# Patient Record
Sex: Female | Born: 1955 | Race: White | Hispanic: No | Marital: Married | Smoking: Former smoker
Health system: Southern US, Community
[De-identification: ages and names within clinical notes are randomized; demographics above are authoritative.]

## PROBLEM LIST (undated history)

## (undated) DIAGNOSIS — J45909 Unspecified asthma, uncomplicated: Secondary | ICD-10-CM

## (undated) DIAGNOSIS — A770 Spotted fever due to Rickettsia rickettsii: Secondary | ICD-10-CM

## (undated) DIAGNOSIS — Z8619 Personal history of other infectious and parasitic diseases: Secondary | ICD-10-CM

## (undated) HISTORY — PX: TONSILLECTOMY: SUR1361

## (undated) HISTORY — PX: REPLACEMENT TOTAL KNEE: SUR1224

## (undated) HISTORY — PX: RHINOPLASTY: SUR1284

---

## 2010-06-14 ENCOUNTER — Ambulatory Visit: Payer: Self-pay | Admitting: Internal Medicine

## 2010-11-09 ENCOUNTER — Ambulatory Visit: Payer: Self-pay | Admitting: Family Medicine

## 2015-01-29 ENCOUNTER — Ambulatory Visit
Admission: RE | Admit: 2015-01-29 | Discharge: 2015-01-29 | Disposition: A | Discharge status: Home / Self Care | Payer: BLUE CROSS/BLUE SHIELD | Source: Ambulatory Visit | Attending: Family Medicine | Admitting: Family Medicine

## 2015-01-29 ENCOUNTER — Other Ambulatory Visit: Payer: Self-pay | Admitting: Family Medicine

## 2015-01-29 DIAGNOSIS — R590 Localized enlarged lymph nodes: Secondary | ICD-10-CM | POA: Insufficient documentation

## 2015-01-29 DIAGNOSIS — R221 Localized swelling, mass and lump, neck: Secondary | ICD-10-CM | POA: Diagnosis present

## 2015-02-01 ENCOUNTER — Other Ambulatory Visit: Payer: Self-pay | Admitting: Unknown Physician Specialty

## 2015-02-01 DIAGNOSIS — R1310 Dysphagia, unspecified: Secondary | ICD-10-CM

## 2015-02-04 ENCOUNTER — Ambulatory Visit
Admission: RE | Admit: 2015-02-04 | Discharge: 2015-02-04 | Disposition: A | Discharge status: Home / Self Care | Payer: BLUE CROSS/BLUE SHIELD | Source: Ambulatory Visit | Attending: Unknown Physician Specialty | Admitting: Unknown Physician Specialty

## 2015-02-04 DIAGNOSIS — R1310 Dysphagia, unspecified: Secondary | ICD-10-CM

## 2016-07-28 ENCOUNTER — Ambulatory Visit
Admission: RE | Admit: 2016-07-28 | Discharge: 2016-07-28 | Disposition: A | Discharge status: Home / Self Care | Payer: BLUE CROSS/BLUE SHIELD | Source: Ambulatory Visit | Attending: Family Medicine | Admitting: Family Medicine

## 2016-07-28 ENCOUNTER — Other Ambulatory Visit: Payer: Self-pay | Admitting: Family Medicine

## 2016-07-28 DIAGNOSIS — R509 Fever, unspecified: Secondary | ICD-10-CM

## 2016-07-28 DIAGNOSIS — R059 Cough, unspecified: Secondary | ICD-10-CM

## 2016-07-28 DIAGNOSIS — J189 Pneumonia, unspecified organism: Secondary | ICD-10-CM | POA: Diagnosis not present

## 2016-07-28 DIAGNOSIS — R05 Cough: Secondary | ICD-10-CM

## 2017-05-24 IMAGING — CR DG CHEST 2V
2 series · 2 of 2 positions shown · non-contrast
Comparison: None in PACs

CLINICAL DATA: Productive cough, exertional shortness of breath,
fever, nausea vomiting and diarrhea and anterior upper chest
discomfort for the past week. History of asthma -bronchitis. Former
smoker.

EXAM:
CHEST  2 VIEW

[chest pa]
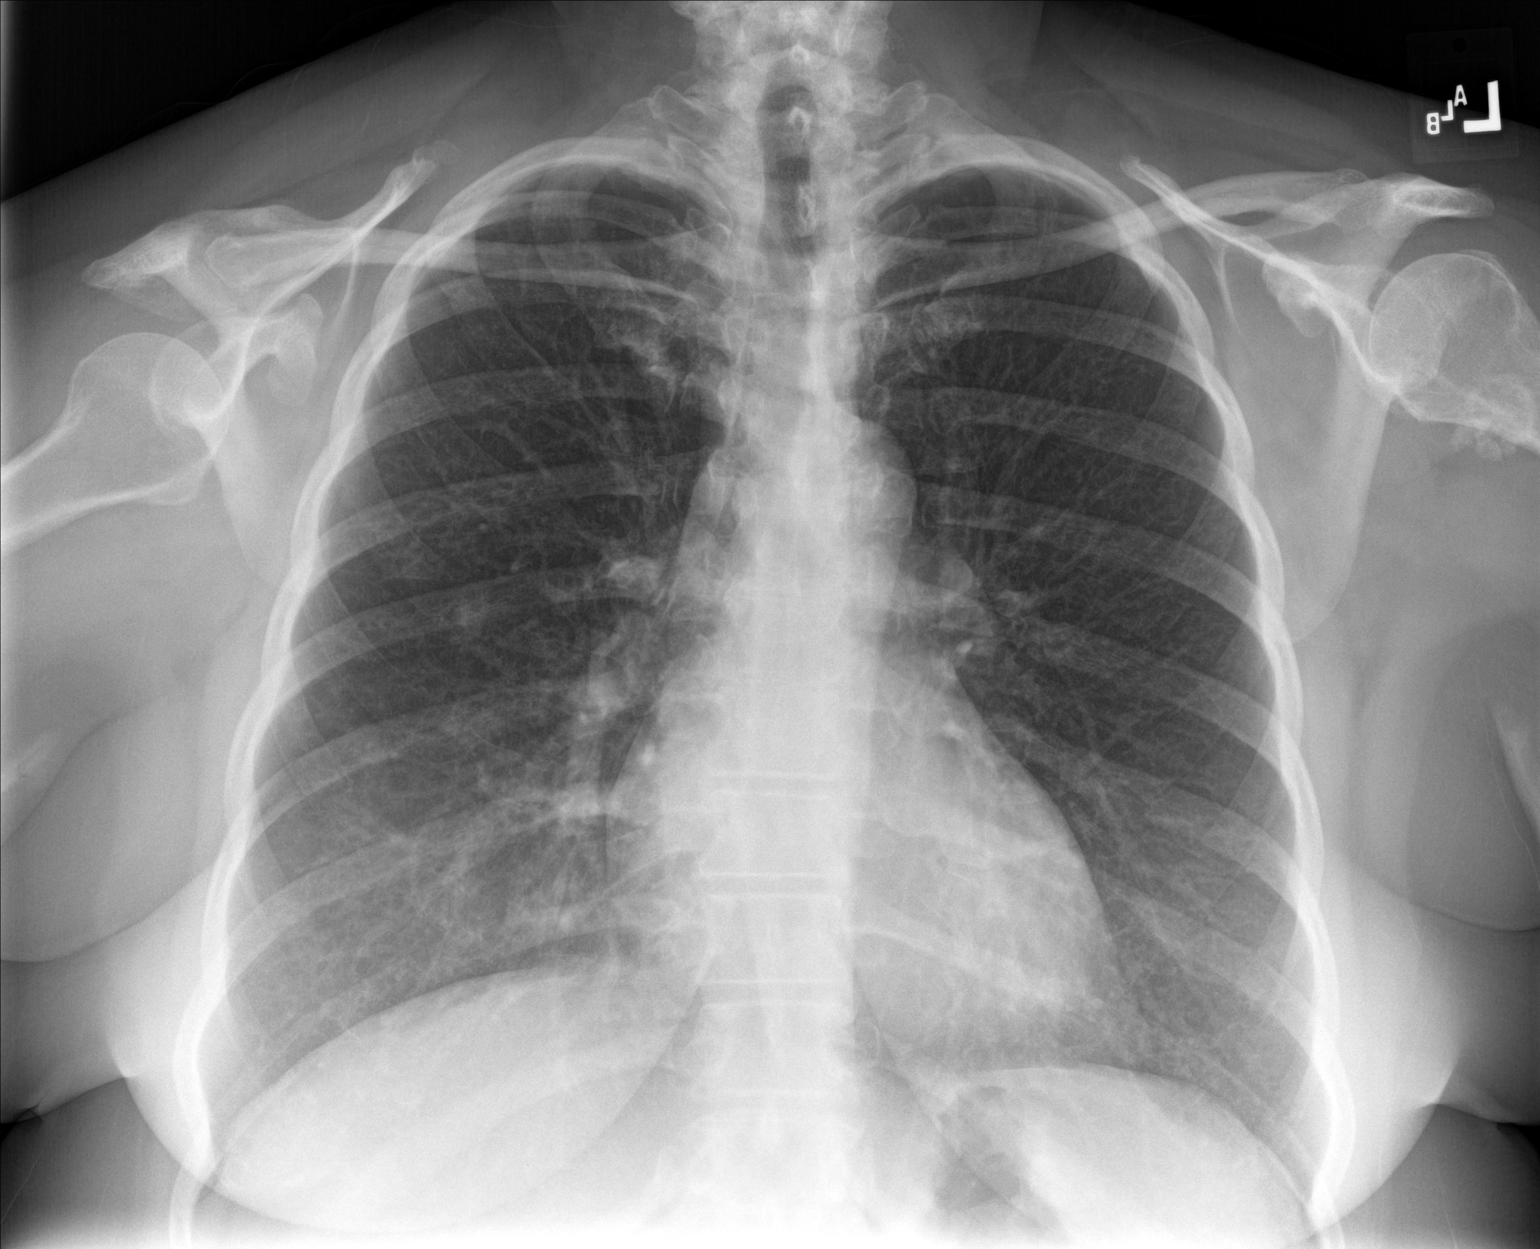

[chest lat]
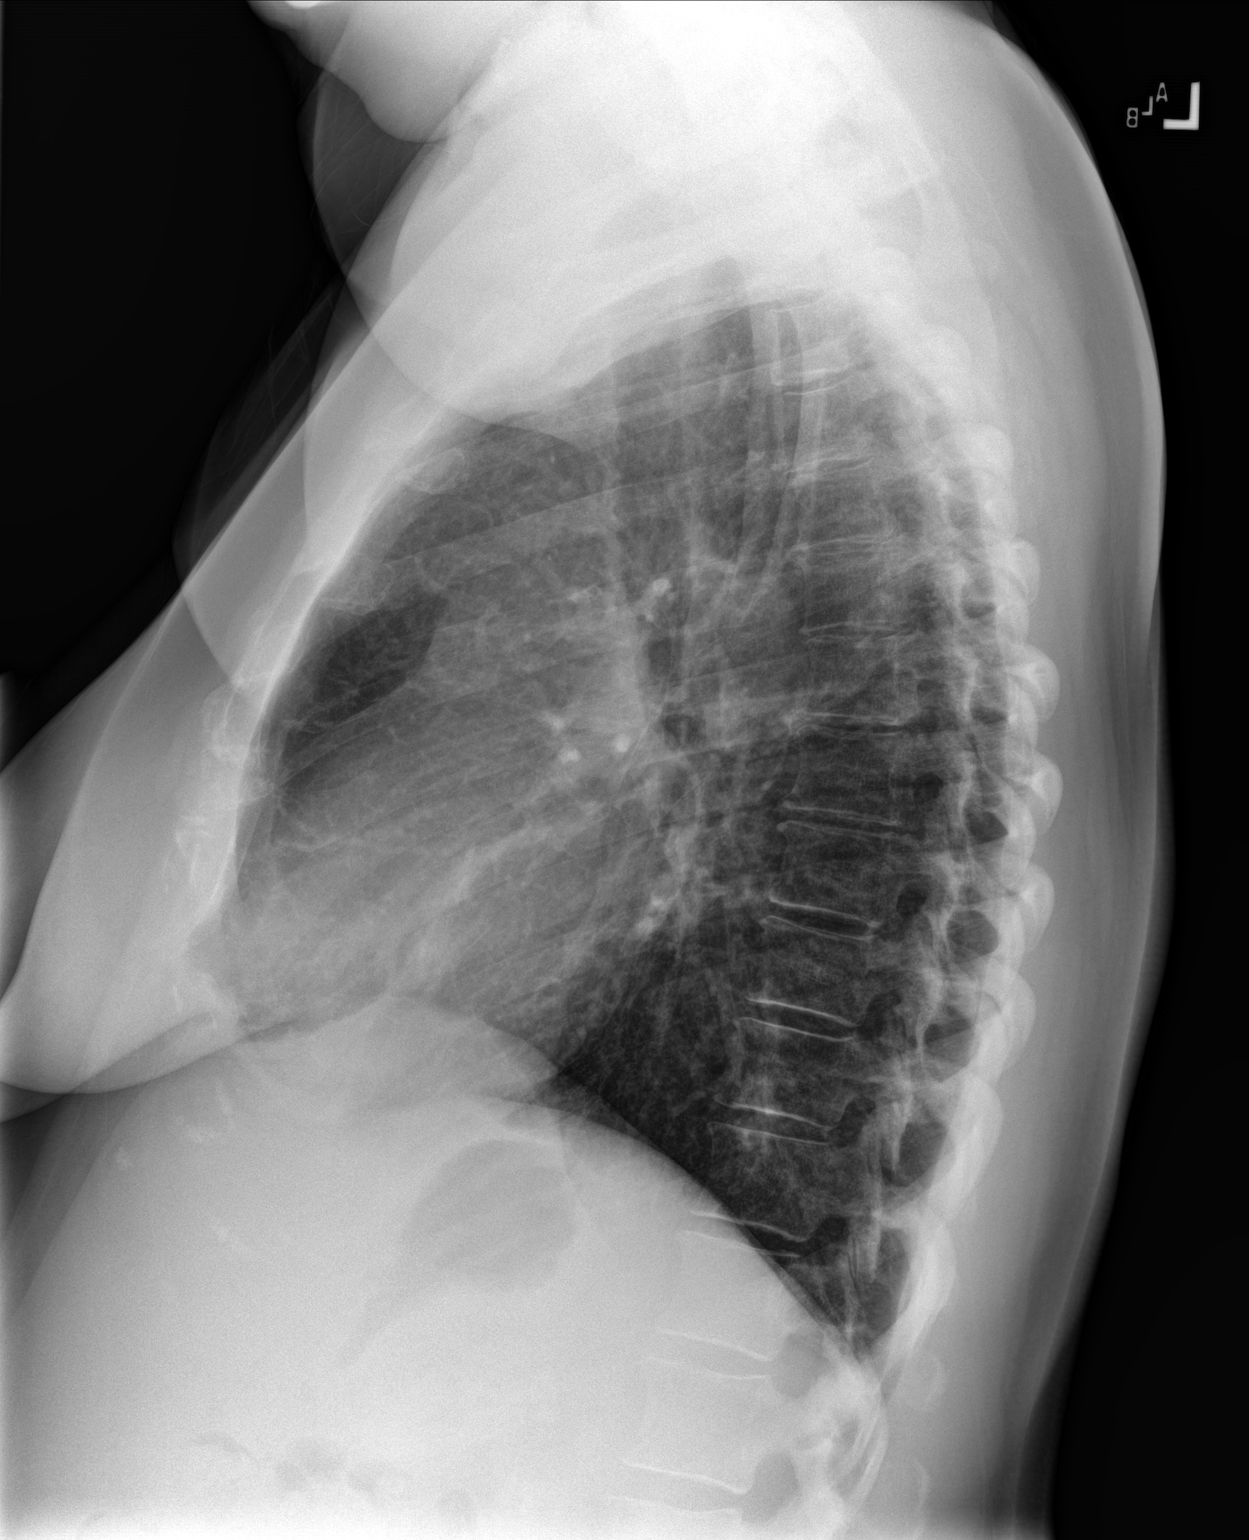

[2 of 2 positions shown; findings below may reference images not displayed]

FINDINGS: The lungs are mildly hyperinflated. There is an infiltrate in the
right infrahilar region likely in the middle lobe. There is no
pleural effusion or pneumothorax. The heart and pulmonary
vascularity are normal. The mediastinum is normal in width. The bony
thorax exhibits no acute abnormality.
IMPRESSION: Chronic bronchitic change with superimposed pneumonia in the medial
aspect of the right middle lobe. Followup PA and lateral chest X-ray
is recommended in 3-4 weeks following trial of antibiotic therapy to
ensure resolution and exclude underlying malignancy.

## 2019-10-30 ENCOUNTER — Other Ambulatory Visit: Payer: Self-pay

## 2019-10-30 ENCOUNTER — Emergency Department
Admission: EM | Admit: 2019-10-30 | Discharge: 2019-10-30 | Disposition: A | Discharge status: Home / Self Care | Payer: BC Managed Care – PPO | Attending: Emergency Medicine | Admitting: Emergency Medicine

## 2019-10-30 ENCOUNTER — Encounter: Payer: Self-pay | Admitting: Emergency Medicine

## 2019-10-30 DIAGNOSIS — Y999 Unspecified external cause status: Secondary | ICD-10-CM | POA: Diagnosis not present

## 2019-10-30 DIAGNOSIS — Z23 Encounter for immunization: Secondary | ICD-10-CM | POA: Insufficient documentation

## 2019-10-30 DIAGNOSIS — Z96653 Presence of artificial knee joint, bilateral: Secondary | ICD-10-CM | POA: Diagnosis not present

## 2019-10-30 DIAGNOSIS — Z87891 Personal history of nicotine dependence: Secondary | ICD-10-CM | POA: Diagnosis not present

## 2019-10-30 DIAGNOSIS — Y929 Unspecified place or not applicable: Secondary | ICD-10-CM | POA: Diagnosis not present

## 2019-10-30 DIAGNOSIS — L089 Local infection of the skin and subcutaneous tissue, unspecified: Secondary | ICD-10-CM | POA: Diagnosis not present

## 2019-10-30 DIAGNOSIS — W5501XA Bitten by cat, initial encounter: Secondary | ICD-10-CM | POA: Diagnosis not present

## 2019-10-30 DIAGNOSIS — Y939 Activity, unspecified: Secondary | ICD-10-CM | POA: Diagnosis not present

## 2019-10-30 DIAGNOSIS — S51852A Open bite of left forearm, initial encounter: Secondary | ICD-10-CM | POA: Diagnosis not present

## 2019-10-30 HISTORY — DX: Unspecified asthma, uncomplicated: J45.909

## 2019-10-30 HISTORY — DX: Spotted fever due to Rickettsia rickettsii: A77.0

## 2019-10-30 HISTORY — DX: Personal history of other infectious and parasitic diseases: Z86.19

## 2019-10-30 LAB — CBC WITH DIFFERENTIAL/PLATELET
Abs Immature Granulocytes: 0.02 10*3/uL (ref 0.00–0.07)
Basophils Absolute: 0.1 10*3/uL (ref 0.0–0.1)
Basophils Relative: 1 %
Eosinophils Absolute: 0.3 10*3/uL (ref 0.0–0.5)
Eosinophils Relative: 3 %
HCT: 45.5 % (ref 36.0–46.0)
Hemoglobin: 15.1 g/dL — ABNORMAL HIGH (ref 12.0–15.0)
Immature Granulocytes: 0 %
Lymphocytes Relative: 25 %
Lymphs Abs: 2.3 10*3/uL (ref 0.7–4.0)
MCH: 30.9 pg (ref 26.0–34.0)
MCHC: 33.2 g/dL (ref 30.0–36.0)
MCV: 93 fL (ref 80.0–100.0)
Monocytes Absolute: 0.7 10*3/uL (ref 0.1–1.0)
Monocytes Relative: 7 %
Neutro Abs: 5.8 10*3/uL (ref 1.7–7.7)
Neutrophils Relative %: 64 %
Platelets: 235 10*3/uL (ref 150–400)
RBC: 4.89 MIL/uL (ref 3.87–5.11)
RDW: 12.1 % (ref 11.5–15.5)
WBC: 9.1 10*3/uL (ref 4.0–10.5)
nRBC: 0 % (ref 0.0–0.2)

## 2019-10-30 LAB — BASIC METABOLIC PANEL
Anion gap: 11 (ref 5–15)
BUN: 19 mg/dL (ref 8–23)
CO2: 21 mmol/L — ABNORMAL LOW (ref 22–32)
Calcium: 9.5 mg/dL (ref 8.9–10.3)
Chloride: 102 mmol/L (ref 98–111)
Creatinine, Ser: 0.86 mg/dL (ref 0.44–1.00)
GFR calc Af Amer: >60 mL/min (ref >60)
GFR calc non Af Amer: >60 mL/min (ref >60)
Glucose, Bld: 282 mg/dL — ABNORMAL HIGH (ref 70–99)
Potassium: 5 mmol/L (ref 3.5–5.1)
Sodium: 134 mmol/L — ABNORMAL LOW (ref 135–145)

## 2019-10-30 LAB — LACTIC ACID, PLASMA: Lactic Acid, Venous: 1.9 mmol/L (ref 0.5–1.9)

## 2019-10-30 MED ORDER — TETANUS-DIPHTH-ACELL PERTUSSIS 5-2.5-18.5 LF-MCG/0.5 IM SUSP
0.5000 mL | Freq: Once | INTRAMUSCULAR | Status: AC
  Administered 2019-10-30: 0.5 mL via INTRAMUSCULAR
  Filled 2019-10-30: qty 0.5

## 2019-10-30 MED ORDER — METRONIDAZOLE 500 MG PO TABS: 500.0000 mg | ORAL_TABLET | Freq: Three times a day (TID) | ORAL | 0 refills | Status: AC

## 2019-10-30 MED ORDER — METRONIDAZOLE 500 MG PO TABS
500.0000 mg | ORAL_TABLET | Freq: Once | ORAL | Status: AC
  Administered 2019-10-30: 500 mg via ORAL
  Filled 2019-10-30: qty 1

## 2019-10-30 MED ORDER — DOXYCYCLINE HYCLATE 100 MG PO CAPS: 100.0000 mg | ORAL_CAPSULE | Freq: Two times a day (BID) | ORAL | 0 refills | Status: AC

## 2019-10-30 NOTE — Discharge Instructions (Signed)
Please follow up with your primary care provider if not improving over the next couple of days.  Return to the ER for symptoms that change or worsen or for new concerns if unable to schedule an appointment.

## 2019-10-30 NOTE — ED Provider Notes (Signed)
[facility] Emergency Department Provider Note  ____________________________________________  Time seen: Approximately 11:57 PM  I have reviewed the triage vital signs and the nursing notes.   HISTORY  Chief Complaint Arm Pain   HPI Martha Brewer is a 64 y.o. female presenting to the emergency department for treatment and evaluation of the left forearm pain after being bitten by her cat yesterday. She started taking Keflex, but the area has become firm and erythema has gotten worse. Tdap may not be current. Fever yesterday but none today.   Past Medical History:  Diagnosis Date  . Asthma    3 weeks out of year  . History of Lyme disease   . [facility] spotted fever     There are no problems to display for this patient.   Past Surgical History:  Procedure Laterality Date  . REPLACEMENT TOTAL KNEE     both have been replaced different years  . RHINOPLASTY    . TONSILLECTOMY      Prior to Admission medications   Medication Sig Start Date End Date Taking? Authorizing Provider  doxycycline (VIBRAMYCIN) 100 MG capsule Take 1 capsule (100 mg total) by mouth 2 (two) times daily. 10/30/19   Malorie Bigford, Johnette Abraham B, FNP  metroNIDAZOLE (FLAGYL) 500 MG tablet Take 1 tablet (500 mg total) by mouth 3 (three) times daily. 10/30/19   Lexxus Underhill, Johnette Abraham B, FNP    Allergies Erythromycin, Other, Penicillins, and Sulfa antibiotics  History reviewed. No pertinent family history.  Social History Social History   Tobacco Use  . Smoking status: Former Smoker    Quit date: 2014    Years since quitting: 7.2  . Smokeless tobacco: Never Used  Substance Use Topics  . Alcohol use: Never  . Drug use: Never    Review of Systems  Constitutional: Positive for fever. Respiratory: Negative for cough or shortness of breath.  Musculoskeletal: Negative for myalgias Skin: Positive for erythema and swelling with puncture wounds and scratches. Neurological: Negative for  numbness or paresthesias. ____________________________________________   PHYSICAL EXAM:  VITAL SIGNS: ED Triage Vitals  Enc Vitals Group     BP 10/30/19 1654 (!) 143/88     Pulse Rate 10/30/19 1654 97     Resp 10/30/19 1654 20     Temp 10/30/19 1654 98.3 F (36.8 C)     Temp Source 10/30/19 1654 Oral     SpO2 10/30/19 1654 93 %     Weight 10/30/19 1654 200 lb (90.7 kg)     Height 10/30/19 1654 5\' 3"  (1.6 m)     Head Circumference --      Peak Flow --      Pain Score 10/30/19 1702 7     Pain Loc --      Pain Edu? --      Excl. in [place]? --      Constitutional: Overall well appearing. Eyes: Conjunctivae are clear without discharge or drainage. Nose: No rhinorrhea noted. Mouth/Throat: Airway is patent.  Neck: No stridor. Unrestricted range of motion observed. Cardiovascular: Capillary refill is <3 seconds.  Respiratory: Respirations are even and unlabored.. Musculoskeletal: Unrestricted range of motion observed. Neurologic: Awake, alert, and oriented x 4.  Skin:  Area of induration and erythema noted on the volar aspect of the left forearm with multiple puncture wounds and scratches. No drainage or open wounds.  ____________________________________________   LABS (all labs ordered are listed, but only abnormal results are displayed)  Labs Reviewed  CBC WITH DIFFERENTIAL/PLATELET -  Abnormal; Notable for the following components:      Result Value   Hemoglobin 15.1 (*)    All other components within normal limits  BASIC METABOLIC PANEL - Abnormal; Notable for the following components:   Sodium 134 (*)    CO2 21 (*)    Glucose, Bld 282 (*)    All other components within normal limits  LACTIC ACID, PLASMA   ____________________________________________  EKG  Not indicated. ____________________________________________  RADIOLOGY  Not  indicated. ____________________________________________   PROCEDURES  Procedures ____________________________________________   INITIAL IMPRESSION / ASSESSMENT AND PLAN / ED COURSE  Martha Brewer is a 64 y.o. female presenting to the emergency department for treatment after being bitten by her cat. She reports it is up to date on rabies vaccinations. Despite keflex, the area has continued to become more red and swollen.   Labs are reassuring. No indication of sepsis or deep space infection. No lymphangitis on exam.  Plan will be to have her take doxycycline and flagyl since she is allergic to PCN. First dose of flagyl given tonight. She will pick up Rx in the morning and stop the keflex. Tdap booster given tonight as well.  She is to follow up with primary care or return to the ER for symptoms of concern.   Medications  metroNIDAZOLE (FLAGYL) tablet 500 mg (500 mg Oral Given 10/30/19 2057)  Tdap (BOOSTRIX) injection 0.5 mL (0.5 mLs Intramuscular Given 10/30/19 2057)     Pertinent labs & imaging results that were available during my care of the patient were reviewed by me and considered in my medical decision making (see chart for details).  ____________________________________________   FINAL CLINICAL IMPRESSION(S) / ED DIAGNOSES  Final diagnoses:  Cat bite of left forearm with infection, initial encounter    ED Discharge Orders         Ordered    metroNIDAZOLE (FLAGYL) 500 MG tablet  3 times daily     10/30/19 2053    doxycycline (VIBRAMYCIN) 100 MG capsule  2 times daily     10/30/19 2053           Note:  This document was prepared using Dragon voice recognition software and may include unintentional dictation errors.   Chinita Pester, FNP 10/31/19 1647    Phineas Semen, MD 11/03/19 [phone]

## 2019-10-30 NOTE — ED Triage Notes (Addendum)
Pt has redness and swelling to left forearm.  Fever 102 yesterday.  Pt was started on keflex. Swelling getting worse despite abx; started yesterday.  Was bit by cat. Unknown last Tdap, thinks greater than 10 years.
